# Patient Record
Sex: Male | Born: 1960 | Race: Black or African American | Hispanic: No | Marital: Married | Smoking: Current every day smoker
Health system: Southern US, Community
[De-identification: ages and names within clinical notes are randomized; demographics above are authoritative.]

## PROBLEM LIST (undated history)

## (undated) DIAGNOSIS — I10 Essential (primary) hypertension: Secondary | ICD-10-CM

## (undated) DIAGNOSIS — G4730 Sleep apnea, unspecified: Secondary | ICD-10-CM

---

## 2011-11-30 ENCOUNTER — Emergency Department (HOSPITAL_BASED_OUTPATIENT_CLINIC_OR_DEPARTMENT_OTHER): Payer: 59

## 2011-11-30 ENCOUNTER — Emergency Department (HOSPITAL_BASED_OUTPATIENT_CLINIC_OR_DEPARTMENT_OTHER)
Admission: EM | Admit: 2011-11-30 | Discharge: 2011-11-30 | Disposition: A | Discharge status: Home / Self Care | Payer: 59 | Attending: Emergency Medicine | Admitting: Emergency Medicine

## 2011-11-30 ENCOUNTER — Encounter (HOSPITAL_BASED_OUTPATIENT_CLINIC_OR_DEPARTMENT_OTHER): Payer: Self-pay | Admitting: *Deleted

## 2011-11-30 DIAGNOSIS — I10 Essential (primary) hypertension: Secondary | ICD-10-CM | POA: Insufficient documentation

## 2011-11-30 DIAGNOSIS — K5732 Diverticulitis of large intestine without perforation or abscess without bleeding: Secondary | ICD-10-CM | POA: Insufficient documentation

## 2011-11-30 DIAGNOSIS — E119 Type 2 diabetes mellitus without complications: Secondary | ICD-10-CM | POA: Insufficient documentation

## 2011-11-30 DIAGNOSIS — F17200 Nicotine dependence, unspecified, uncomplicated: Secondary | ICD-10-CM | POA: Insufficient documentation

## 2011-11-30 DIAGNOSIS — G4730 Sleep apnea, unspecified: Secondary | ICD-10-CM | POA: Insufficient documentation

## 2011-11-30 DIAGNOSIS — K5792 Diverticulitis of intestine, part unspecified, without perforation or abscess without bleeding: Secondary | ICD-10-CM

## 2011-11-30 HISTORY — DX: Essential (primary) hypertension: I10

## 2011-11-30 HISTORY — DX: Sleep apnea, unspecified: G47.30

## 2011-11-30 LAB — COMPREHENSIVE METABOLIC PANEL
AST: 26 U/L (ref 0–37)
Albumin: 4.2 g/dL (ref 3.5–5.2)
Chloride: 98 mEq/L (ref 96–112)
Creatinine, Ser: 0.8 mg/dL (ref 0.50–1.35)
Total Bilirubin: 1.6 mg/dL — ABNORMAL HIGH (ref 0.3–1.2)

## 2011-11-30 LAB — CBC WITH DIFFERENTIAL/PLATELET
Basophils Absolute: 0 10*3/uL (ref 0.0–0.1)
Basophils Relative: 0 % (ref 0–1)
HCT: 38.7 % — ABNORMAL LOW (ref 39.0–52.0)
MCHC: 35.4 g/dL (ref 30.0–36.0)
Monocytes Absolute: 0.8 10*3/uL (ref 0.1–1.0)
Neutro Abs: 12.4 10*3/uL — ABNORMAL HIGH (ref 1.7–7.7)
Neutrophils Relative %: 87 % — ABNORMAL HIGH (ref 43–77)
Platelets: 336 10*3/uL (ref 150–400)
RDW: 12.7 % (ref 11.5–15.5)

## 2011-11-30 LAB — URINE MICROSCOPIC-ADD ON

## 2011-11-30 LAB — URINALYSIS, ROUTINE W REFLEX MICROSCOPIC
Ketones, ur: NEGATIVE mg/dL
Nitrite: NEGATIVE
Protein, ur: NEGATIVE mg/dL
Urobilinogen, UA: 0.2 mg/dL (ref 0.0–1.0)

## 2011-11-30 MED ORDER — OXYCODONE-ACETAMINOPHEN 5-325 MG PO TABS: 1.0000 | ORAL_TABLET | Freq: Four times a day (QID) | ORAL | Status: AC | PRN

## 2011-11-30 MED ORDER — CIPROFLOXACIN HCL 500 MG PO TABS
500.0000 mg | ORAL_TABLET | Freq: Once | ORAL | Status: AC
  Administered 2011-11-30: 500 mg via ORAL
  Filled 2011-11-30: qty 1

## 2011-11-30 MED ORDER — IOHEXOL 300 MG/ML  SOLN: 20.0000 mL | INTRAMUSCULAR | Status: AC

## 2011-11-30 MED ORDER — OXYCODONE-ACETAMINOPHEN 5-325 MG PO TABS
1.0000 | ORAL_TABLET | Freq: Once | ORAL | Status: AC
  Administered 2011-11-30: 1 via ORAL
  Filled 2011-11-30 (×2): qty 1

## 2011-11-30 MED ORDER — METRONIDAZOLE 500 MG PO TABS: 500.0000 mg | ORAL_TABLET | Freq: Two times a day (BID) | ORAL | Status: AC

## 2011-11-30 MED ORDER — METRONIDAZOLE 500 MG PO TABS
500.0000 mg | ORAL_TABLET | Freq: Once | ORAL | Status: AC
  Administered 2011-11-30: 500 mg via ORAL
  Filled 2011-11-30: qty 1

## 2011-11-30 MED ORDER — SODIUM CHLORIDE 0.9 % IV SOLN
Freq: Once | INTRAVENOUS | Status: AC
  Administered 2011-11-30: 19:00:00 via INTRAVENOUS

## 2011-11-30 MED ORDER — CIPROFLOXACIN HCL 500 MG PO TABS: 500.0000 mg | ORAL_TABLET | Freq: Two times a day (BID) | ORAL | Status: AC

## 2011-11-30 MED ORDER — IOHEXOL 300 MG/ML  SOLN
100.0000 mL | Freq: Once | INTRAMUSCULAR | Status: AC | PRN
  Administered 2011-11-30: 100 mL via INTRAVENOUS

## 2011-11-30 NOTE — ED Notes (Signed)
Abdominal pain for a few weeks. Admits to daily alcohol use. No hx of pancreatitis.

## 2011-11-30 NOTE — ED Provider Notes (Addendum)
History     CSN: [id number]  Arrival date & time 11/30/11  2130   First MD Initiated Contact with Patient 11/30/11 1819      Chief Complaint  Patient presents with  . Abdominal Pain    (Consider location/radiation/quality/duration/timing/severity/associated sxs/prior treatment) Patient is a 51 y.o. male presenting with abdominal pain. The history is provided by the patient.  Abdominal Pain The primary symptoms of the illness include abdominal pain, nausea, diarrhea and dysuria. The primary symptoms of the illness do not include vomiting. Episode onset: Intermittent for the last 3 weeks but worse over the last 2 days. The onset of the illness was gradual. The problem has been gradually worsening.  The abdominal pain is located in the RLQ and suprapubic region. The abdominal pain does not radiate. The severity of the abdominal pain is 5/10. The abdominal pain is relieved by nothing. The abdominal pain is exacerbated by urination.  The dysuria began 2 days ago Lambert Mody shooting pains at the end of his urine stream). The discomfort is mild. He is not currently sexually active. The dysuria is not associated with discharge, hematuria, frequency or urgency.  Associated with: Patient does drink beer daily. Symptoms associated with the illness do not include anorexia, urgency, hematuria, frequency or back pain.    Past Medical History  Diagnosis Date  . Diabetes mellitus   . Hypertension   . Sleep apnea     History reviewed. No pertinent past surgical history.  No family history on file.  History  Substance Use Topics  . Smoking status: Current Everyday Smoker -- 0.5 packs/day  . Smokeless tobacco: Not on file  . Alcohol Use: Yes      Review of Systems  Gastrointestinal: Positive for nausea, abdominal pain and diarrhea. Negative for vomiting and anorexia.  Genitourinary: Positive for dysuria. Negative for urgency, frequency and hematuria.  Musculoskeletal: Negative for back pain.    All other systems reviewed and are negative.    Allergies  Review of patient's allergies indicates no known allergies.  Home Medications  No current outpatient prescriptions on file.  BP 132/77  Pulse 90  Temp 100.7 F (38.2 C) (Oral)  Resp 16  SpO2 96%  Physical Exam  Nursing note and vitals reviewed. Constitutional: He is oriented to person, place, and time. He appears well-developed and well-nourished. No distress.  HENT:  Head: Normocephalic and atraumatic.  Mouth/Throat: Oropharynx is clear and moist.  Eyes: Conjunctivae and EOM are normal. Pupils are equal, round, and reactive to light.  Neck: Normal range of motion. Neck supple.  Cardiovascular: Normal rate, regular rhythm and intact distal pulses.   No murmur heard. Pulmonary/Chest: Effort normal and breath sounds normal. No respiratory distress. He has no wheezes. He has no rales.  Abdominal: Soft. Normal appearance. He exhibits no distension. Bowel sounds are decreased. There is tenderness in the right lower quadrant and suprapubic area. There is guarding. There is no rebound and no CVA tenderness. No hernia.  Musculoskeletal: Normal range of motion. He exhibits no edema and no tenderness.  Neurological: He is alert and oriented to person, place, and time.  Skin: Skin is warm and dry. No rash noted. No erythema.  Psychiatric: He has a normal mood and affect. His behavior is normal.    ED Course  Procedures (including critical care time)  Labs Reviewed  URINALYSIS, ROUTINE W REFLEX MICROSCOPIC - Abnormal; Notable for the following:    Leukocytes, UA TRACE (*)     All other components within  normal limits  CBC WITH DIFFERENTIAL - Abnormal; Notable for the following:    WBC 14.2 (*)     HCT 38.7 (*)     Neutrophils Relative 87 (*)     Neutro Abs 12.4 (*)     Lymphocytes Relative 7 (*)     All other components within normal limits  COMPREHENSIVE METABOLIC PANEL - Abnormal; Notable for the following:     Glucose, Bld 104 (*)     Total Bilirubin 1.6 (*)     All other components within normal limits  LIPASE, BLOOD  URINE MICROSCOPIC-ADD ON   Ct Abdomen Pelvis W Contrast  11/30/2011  *RADIOLOGY REPORT*  Clinical Data: Neck pain.  Nausea diarrhea.  Low grade fever.  CT ABDOMEN AND PELVIS WITH CONTRAST  Technique:  Multidetector CT imaging of the abdomen and pelvis was performed following the standard protocol during bolus administration of intravenous contrast.  Contrast: OMNIPAQUE IOHEXOL 300 MG/ML  SOLN  Comparison: None.  Findings: Mild atelectatic changes seen on bases.  No pleural or pericardial effusion.  The gallbladder, liver, spleen, adrenal glands and pancreas are unremarkable.  There is a large low attenuating lesion in the right kidney measuring 6.4 x 5.8 cm consistent with a cyst.  A tiny low attenuating lesion in the mid pole of the left kidney is likely a cyst but cannot be definitively characterized.  The patient has extensive sigmoid diverticular disease.  Intense inflammatory change is present about the mid sigmoid consistent with diverticulitis.  No abscess or perforation is identified. Mild dilatation of regional small bowel loops is compatible with ileus.  No bowel obstruction is seen.  The patient has a large stool burden in the ascending transverse colon.  The appendix is normal appearance.  Scattered atherosclerotic vascular disease in a nonaneurysmal aorta is noted.  There is no lymphadenopathy.  No focal bony abnormality. Degenerative disease lower lumbar spine and appears worst at L4-5 and L5-S1. Also seen is some degenerative change about the hips.  IMPRESSION:  1.  Sigmoid diverticulitis without abscess or perforation. 2.  Large right renal cyst. 3.  Atherosclerosis.   Original Report Authenticated By: Bernadene Bell. Maricela Curet, M.D.      No diagnosis found.    MDM   Patient with intermittent abdominal pain for the last 3 weeks but worse over the last 2 days. Significant  suprapubic and right lower quadrant pain. Patient is also complaining of dysuria and today is running a low-grade temperature of 100.7. Some nausea but no vomiting. He has no tenderness in the upper abdomen.  Concern for UTI versus appendicitis. CBC, CMP, lipase, UA pending. Patient refused pain medication at this time  7:25 PM CMP within normal limits except for mild elevated total bili of 1.6. White count with a leukocytosis of 14,000 but only trace leukocytes without evidence of a substantial UTI. Feel patient needs a CT scan to rule out appendicitis  8:36 PM Ct shows diverticulitis without perforation.  Will give po antibiotics and d/c home.    Gwyneth Sprout, MD 11/30/11 1925  Gwyneth Sprout, MD 11/30/11 2036

## 2013-09-09 IMAGING — CT CT ABD-PELV W/ CM
2 of 5 series · 16 of 46 positions shown, 18 images · IV contrast (omnipaque)
Comparison: None.

CLINICAL DATA: Neck pain.  Nausea diarrhea.  Low grade fever.

CT ABDOMEN AND PELVIS WITH CONTRAST
TECHNIQUE: Multidetector CT imaging of the abdomen and pelvis was
performed following the standard protocol during bolus
administration of intravenous contrast.
Contrast: 100mL OMNIPAQUE IOHEXOL 300 MG/ML  SOLN

[Series 2: abd/pelvis 5.0 b31f · axial · 0.71mm/px · z∈[-716,-300]mm · 13 of 93 slices shown, 15 images]
[im 5/93  soft-tissue]
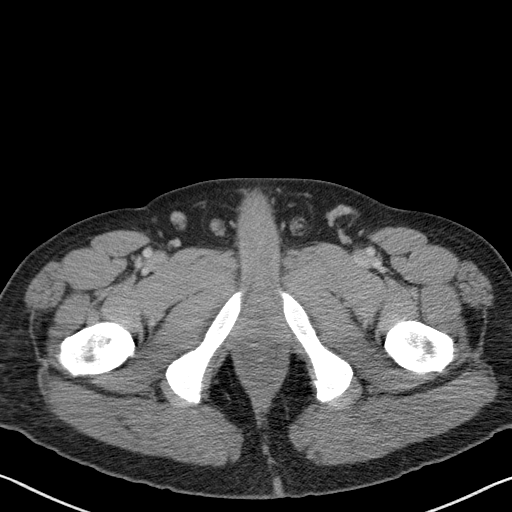
[im 5/93  bone]
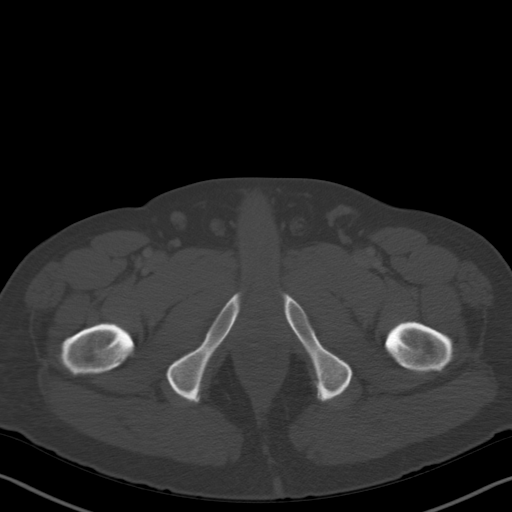
[im 15/93  soft-tissue]
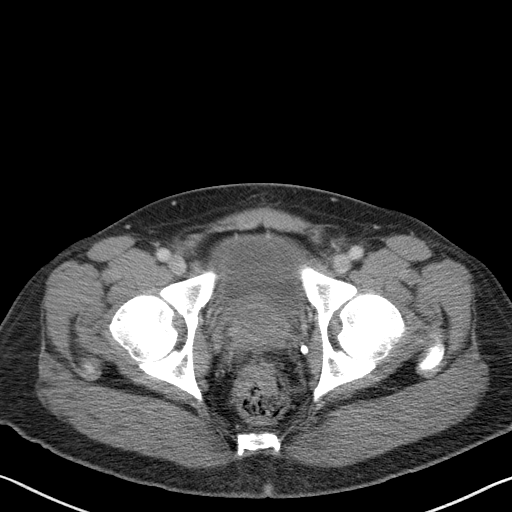
[im 20/93  soft-tissue]
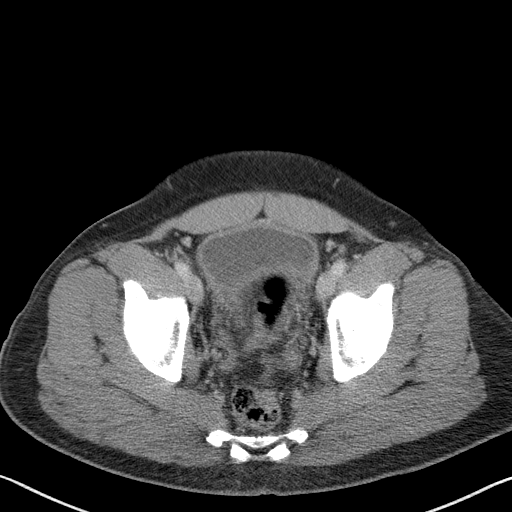
[im 25/93  soft-tissue]
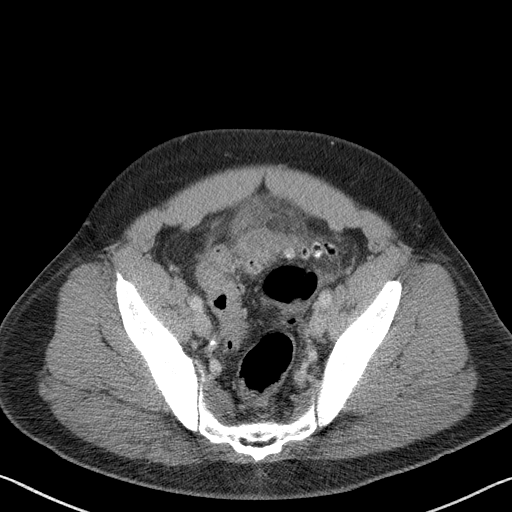
[im 34/93  soft-tissue]
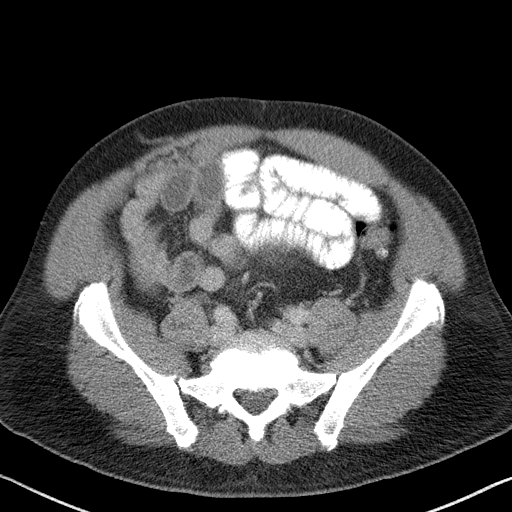
[im 39/93  soft-tissue]
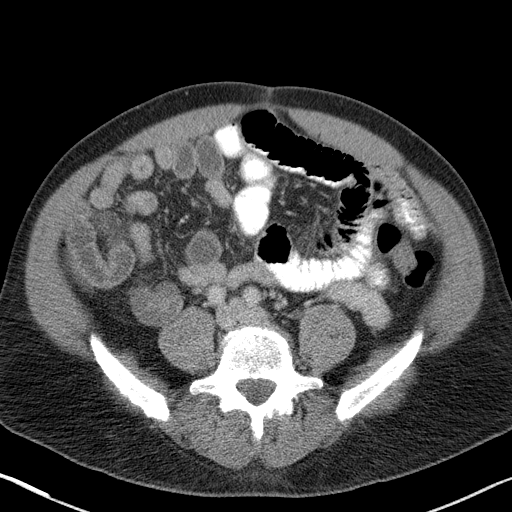
[im 49/93  soft-tissue]
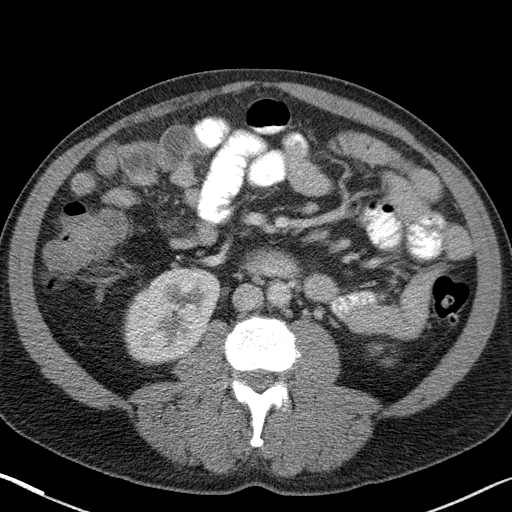
[im 54/93  soft-tissue]
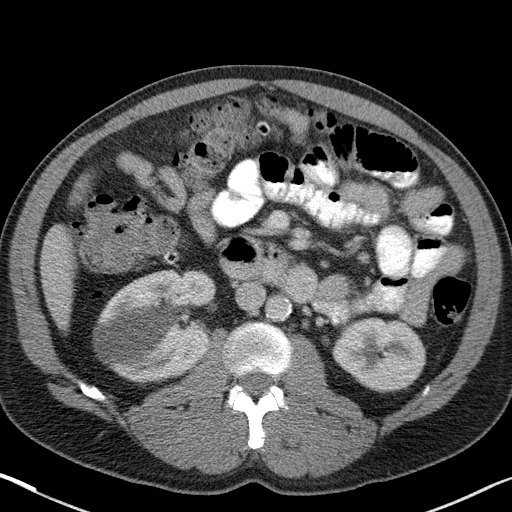
[im 59/93  soft-tissue]
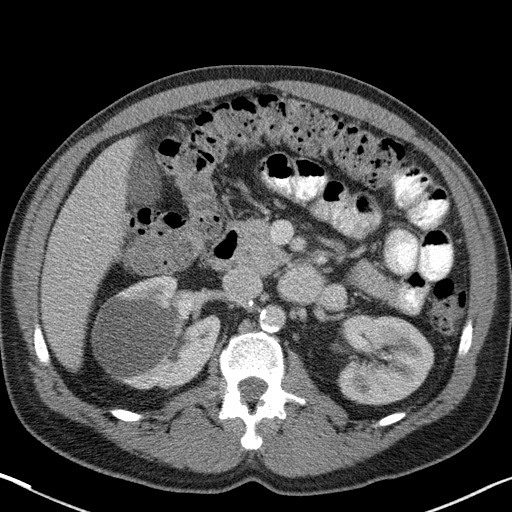
[im 59/93  bone]
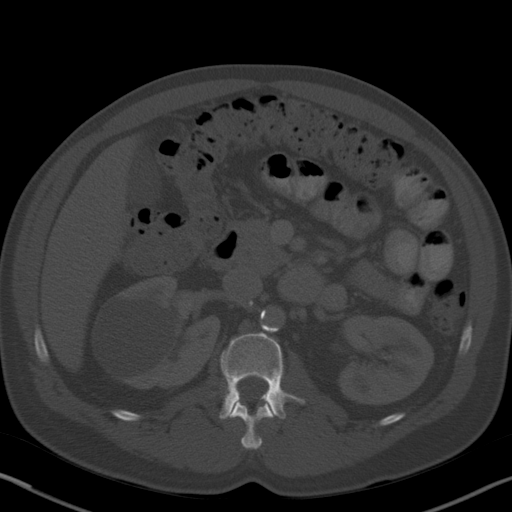
[im 68/93  soft-tissue]
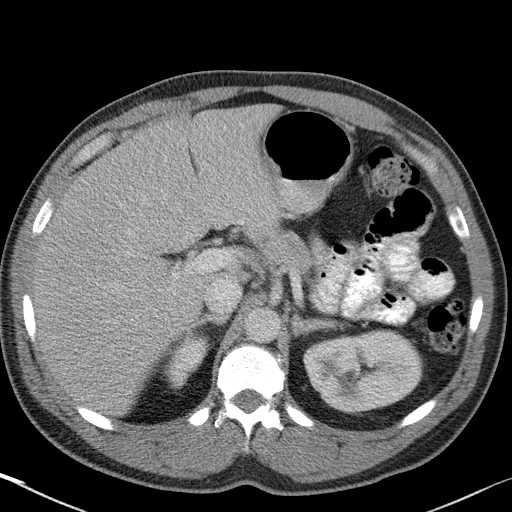
[im 73/93  soft-tissue]
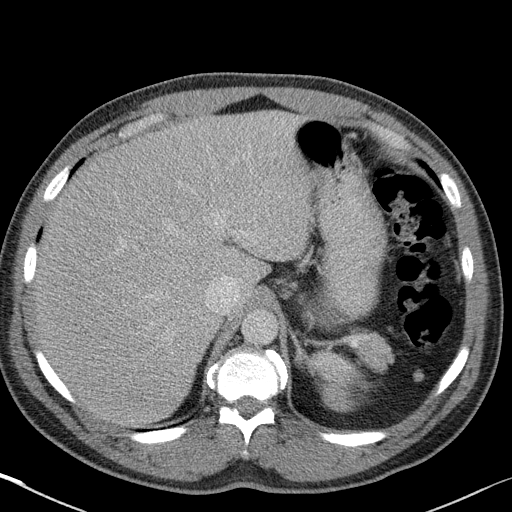
[im 78/93  soft-tissue]
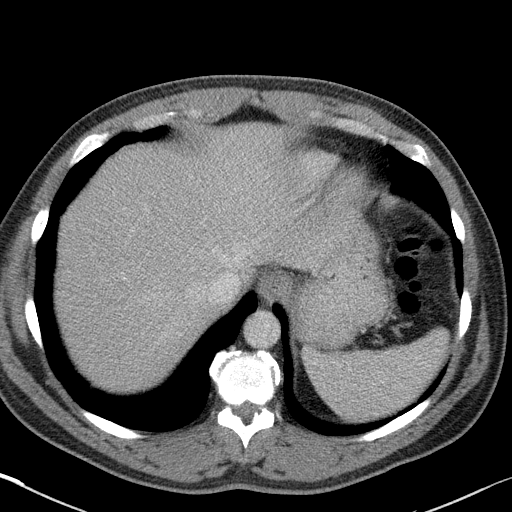
[im 88/93  soft-tissue]
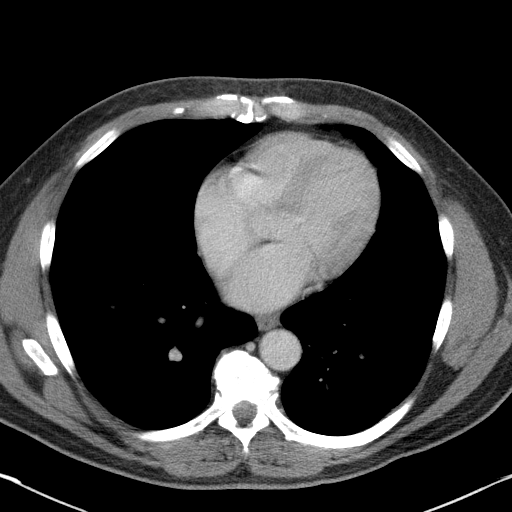

[Series 5: abd/pelvis 3.0 coronal · coronal · 0.73mm/px · 3 of 104 slices shown]
[im 35/104  soft-tissue]
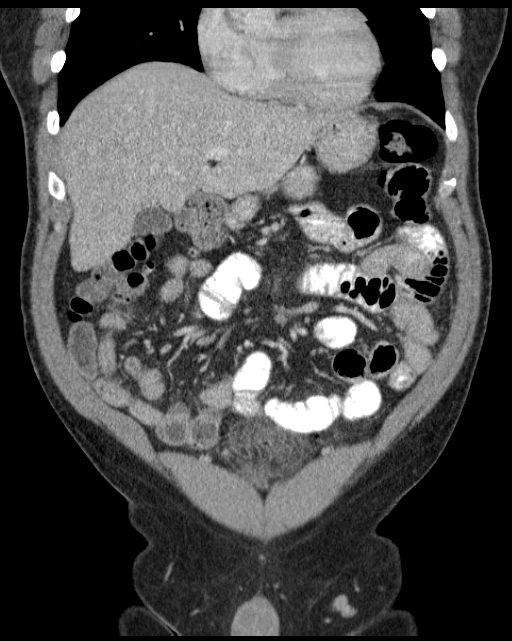
[im 46/104  soft-tissue]
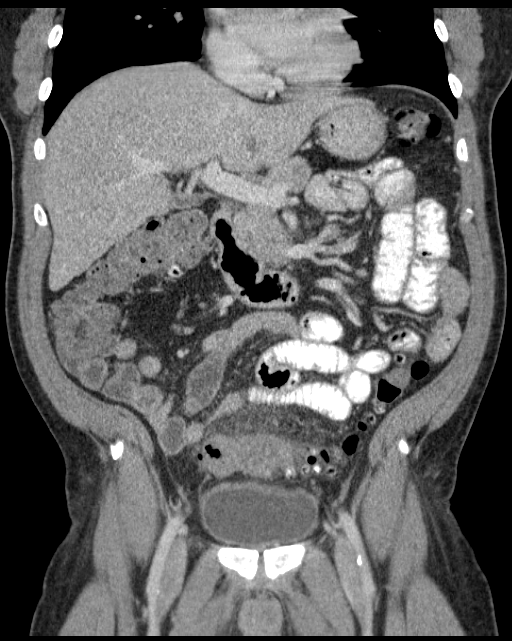
[im 58/104  soft-tissue]
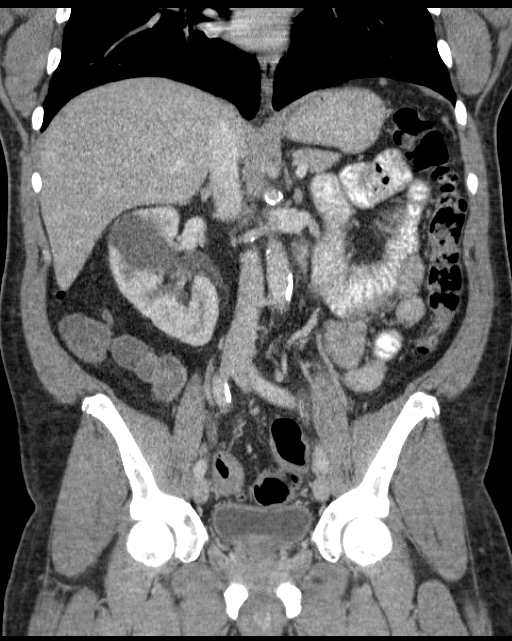

[16 of 46 positions shown; findings below may reference images not displayed]

FINDINGS: Mild atelectatic changes seen on bases.  No pleural or
pericardial effusion.

The gallbladder, liver, spleen, adrenal glands and pancreas are
unremarkable.  There is a large low attenuating lesion in the right
kidney measuring 6.4 x 5.8 cm consistent with a cyst.  A tiny low
attenuating lesion in the mid pole of the left kidney is likely a
cyst but cannot be definitively characterized.

The patient has extensive sigmoid diverticular disease.  Intense
inflammatory change is present about the mid sigmoid consistent
with diverticulitis.  No abscess or perforation is identified.
Mild dilatation of regional small bowel loops is compatible with
ileus.  No bowel obstruction is seen.  The patient has a large
stool burden in the ascending transverse colon.  The appendix is
normal appearance.

Scattered atherosclerotic vascular disease in a nonaneurysmal aorta
is noted.  There is no lymphadenopathy.  No focal bony abnormality.
Degenerative disease lower lumbar spine and appears worst at L4-5
and L5-S1. Also seen is some degenerative change about the hips.
IMPRESSION: 1.  Sigmoid diverticulitis without abscess or perforation.
2.  Large right renal cyst.
3.  Atherosclerosis.

## 2015-11-05 ENCOUNTER — Encounter (HOSPITAL_BASED_OUTPATIENT_CLINIC_OR_DEPARTMENT_OTHER): Payer: Self-pay | Admitting: Emergency Medicine

## 2015-11-05 ENCOUNTER — Emergency Department (HOSPITAL_BASED_OUTPATIENT_CLINIC_OR_DEPARTMENT_OTHER)
Admission: EM | Admit: 2015-11-05 | Discharge: 2015-11-05 | Disposition: A | Discharge status: Home / Self Care | Payer: Self-pay | Attending: Emergency Medicine | Admitting: Emergency Medicine

## 2015-11-05 DIAGNOSIS — F17200 Nicotine dependence, unspecified, uncomplicated: Secondary | ICD-10-CM | POA: Insufficient documentation

## 2015-11-05 DIAGNOSIS — E119 Type 2 diabetes mellitus without complications: Secondary | ICD-10-CM | POA: Insufficient documentation

## 2015-11-05 DIAGNOSIS — Z113 Encounter for screening for infections with a predominantly sexual mode of transmission: Secondary | ICD-10-CM | POA: Insufficient documentation

## 2015-11-05 DIAGNOSIS — Z7984 Long term (current) use of oral hypoglycemic drugs: Secondary | ICD-10-CM | POA: Insufficient documentation

## 2015-11-05 DIAGNOSIS — Z79899 Other long term (current) drug therapy: Secondary | ICD-10-CM | POA: Insufficient documentation

## 2015-11-05 DIAGNOSIS — I10 Essential (primary) hypertension: Secondary | ICD-10-CM | POA: Insufficient documentation

## 2015-11-05 NOTE — ED Provider Notes (Signed)
[facility] Note   CSN: [id number] Arrival date & time: 11/05/15  0865  First Provider Contact:  First MD Initiated Contact with Patient 11/05/15 [phone]        History   Chief Complaint No chief complaint on file.   HPI Phillip Andrews is a 55 y.o. male.  Patient is a 55 year old male with a history of diabetes and hypertension who is presenting today to get a test for gonorrhea. He states he is having no symptoms such as dysuria, penile discharge or lesions. He states he just has to be tested for court case. He refuses a penile swab but once the urine test.   The history is provided by the patient and a friend.    Past Medical History:  Diagnosis Date  . Diabetes mellitus   . Hypertension   . Sleep apnea     There are no active problems to display for this patient.   No past surgical history on file.     Home Medications    Prior to Admission medications   Medication Sig Start Date End Date Taking? Authorizing Provider  acetaminophen (TYLENOL) 500 MG tablet Take 500 mg by mouth every 6 (six) hours as needed. For pain    Historical Provider, MD  CALCIUM PO Take 1 tablet by mouth daily.    Historical Provider, MD  ibuprofen (ADVIL,MOTRIN) 200 MG tablet Take 200 mg by mouth every 6 (six) hours as needed. For pain    Historical Provider, MD  lisinopril-hydrochlorothiazide (PRINZIDE,ZESTORETIC) 20-25 MG per tablet Take 1 tablet by mouth daily.    Historical Provider, MD  metFORMIN (GLUCOPHAGE) 1000 MG tablet Take 1,000 mg by mouth 2 (two) times daily with a meal.    Historical Provider, MD  metoprolol succinate (TOPROL-XL) 50 MG 24 hr tablet Take 50 mg by mouth daily. Take with or immediately following a meal.    Historical Provider, MD  Multiple Vitamin (MULTIVITAMIN WITH MINERALS) TABS Take 1 tablet by mouth daily.    Historical Provider, MD  omega-3 acid ethyl esters (LOVAZA) 1 G capsule Take 2 g by mouth 2 (two) times daily.    Historical Provider,  MD  pravastatin (PRAVACHOL) 80 MG tablet Take 80 mg by mouth at bedtime.    Historical Provider, MD    Family History No family history on file.  Social History Social History  Substance Use Topics  . Smoking status: Current Every Day Smoker    Packs/day: 0.50  . Smokeless tobacco: Not on file  . Alcohol use Yes     Allergies   Review of patient's allergies indicates no known allergies.   Review of Systems Review of Systems  All other systems reviewed and are negative.    Physical Exam Updated Vital Signs BP 159/79 (BP Location: Right Arm)   Pulse 64   Temp 97.9 F (36.6 C) (Oral)   Resp 18   Ht  (1.753 m)   Wt 190 lb (86.2 kg)   SpO2 98%   BMI 28.06 kg/m   Physical Exam  Constitutional: He appears well-developed and well-nourished. No distress.  HENT:  Head: Normocephalic and atraumatic.  Cardiovascular: Normal rate.   Pulmonary/Chest: Effort normal.  Psychiatric: He has a normal mood and affect. His behavior is normal.  Nursing note and vitals reviewed.    ED Treatments / Results  Labs (all labs ordered are listed, but only abnormal results are displayed) Labs Reviewed  GC/CHLAMYDIA PROBE AMP ([place]) NOT AT [facility]  EKG  EKG Interpretation None       Radiology No results found.  Procedures Procedures (including critical care time)  Medications Ordered in ED Medications - No data to display   Initial Impression / Assessment and Plan / ED Course  I have reviewed the triage vital signs and the nursing notes.  Pertinent labs & imaging results that were available during my care of the patient were reviewed by me and considered in my medical decision making (see chart for details).  Clinical Course    Patient presents today to be tested for gonorrhea. He is completely asymptomatic but because of a court case everybody in his home has to be cleared of gonorrhea. He is asymptomatic he has no new sexual partners and he does not  think he has gonorrhea. He refuses a penile swab so a urine test for gonorrhea was done but this patient was instructed that it is not as accurate. However with him being completely asymptomatic it is unlikely that he has gonorrhea.  Final Clinical Impressions(s) / ED Diagnoses   Final diagnoses:  Screen for STD (sexually transmitted disease)    New Prescriptions New Prescriptions   No medications on file     Gwyneth Sprout, MD 11/05/15 0830

## 2015-11-05 NOTE — ED Triage Notes (Signed)
Court ordered to have STD check.

## 2015-11-08 LAB — GC/CHLAMYDIA PROBE AMP (~~LOC~~) NOT AT ARMC
CHLAMYDIA, DNA PROBE: NEGATIVE
Neisseria Gonorrhea: NEGATIVE
